# Patient Record
Sex: Male | Born: 1960 | Race: White | Hispanic: No | Marital: Married
Health system: Southern US, Community
[De-identification: ages and names within clinical notes are randomized; demographics above are authoritative.]

---

## 2012-10-14 ENCOUNTER — Ambulatory Visit: Payer: Self-pay | Admitting: Gastroenterology

## 2012-10-29 ENCOUNTER — Ambulatory Visit: Payer: Self-pay | Admitting: Surgery

## 2012-10-29 LAB — HEPATIC FUNCTION PANEL A (ARMC)
Alkaline Phosphatase: 71 U/L (ref 50–136)
Bilirubin,Total: 0.3 mg/dL (ref 0.2–1.0)
SGOT(AST): 26 U/L (ref 15–37)
SGPT (ALT): 31 U/L (ref 12–78)
Total Protein: 6.7 g/dL (ref 6.4–8.2)

## 2012-10-29 LAB — BASIC METABOLIC PANEL
Anion Gap: 3 — ABNORMAL LOW (ref 7–16)
BUN: 13 mg/dL (ref 7–18)
Chloride: 108 mmol/L — ABNORMAL HIGH (ref 98–107)
Co2: 28 mmol/L (ref 21–32)
EGFR (African American): >60
EGFR (Non-African Amer.): >60
Osmolality: 278 (ref 275–301)
Potassium: 4 mmol/L (ref 3.5–5.1)

## 2012-10-29 LAB — CBC WITH DIFFERENTIAL/PLATELET
Basophil #: 0.2 10*3/uL — ABNORMAL HIGH (ref 0.0–0.1)
Eosinophil #: 0.4 10*3/uL (ref 0.0–0.7)
Eosinophil %: 4.8 %
HCT: 43.5 % (ref 40.0–52.0)
HGB: 14.8 g/dL (ref 13.0–18.0)
Lymphocyte #: 2.3 10*3/uL (ref 1.0–3.6)
Lymphocyte %: 29.1 %
MCH: 31.7 pg (ref 26.0–34.0)
Monocyte #: 0.5 x10 3/mm (ref 0.2–1.0)
Neutrophil #: 4.6 10*3/uL (ref 1.4–6.5)
Neutrophil %: 57.6 %
RDW: 12.8 % (ref 11.5–14.5)

## 2012-10-30 LAB — CEA: CEA: 3.8 ng/mL

## 2012-11-04 ENCOUNTER — Ambulatory Visit: Payer: Self-pay | Admitting: Oncology

## 2012-11-06 ENCOUNTER — Inpatient Hospital Stay: Payer: Self-pay | Admitting: Surgery

## 2012-11-06 LAB — CREATININE, SERUM
Creatinine: 1.21 mg/dL (ref 0.60–1.30)
EGFR (African American): >60

## 2012-11-07 LAB — BASIC METABOLIC PANEL
Anion Gap: 2 — ABNORMAL LOW (ref 7–16)
BUN: 14 mg/dL (ref 7–18)
Co2: 31 mmol/L (ref 21–32)
EGFR (African American): >60
Sodium: 138 mmol/L (ref 136–145)

## 2012-11-07 LAB — CBC WITH DIFFERENTIAL/PLATELET
Basophil #: 0 10*3/uL (ref 0.0–0.1)
Eosinophil #: 0 10*3/uL (ref 0.0–0.7)
Eosinophil %: 0.1 %
HCT: 42.6 % (ref 40.0–52.0)
HGB: 14.7 g/dL (ref 13.0–18.0)
Lymphocyte #: 1.4 10*3/uL (ref 1.0–3.6)
MCH: 32 pg (ref 26.0–34.0)
MCHC: 34.6 g/dL (ref 32.0–36.0)
Monocyte #: 1.1 x10 3/mm — ABNORMAL HIGH (ref 0.2–1.0)
Monocyte %: 12.4 %
Neutrophil #: 6.6 10*3/uL — ABNORMAL HIGH (ref 1.4–6.5)
Neutrophil %: 72.1 %
Platelet: 174 10*3/uL (ref 150–440)
RBC: 4.6 10*6/uL (ref 4.40–5.90)
RDW: 12.8 % (ref 11.5–14.5)
WBC: 9.1 10*3/uL (ref 3.8–10.6)

## 2012-11-08 ENCOUNTER — Ambulatory Visit: Payer: Self-pay | Admitting: Oncology

## 2012-11-11 LAB — CREATININE, SERUM
Creatinine: 1.06 mg/dL (ref 0.60–1.30)
EGFR (African American): >60
EGFR (Non-African Amer.): >60

## 2012-11-15 LAB — PATHOLOGY REPORT

## 2012-11-27 ENCOUNTER — Ambulatory Visit: Payer: Self-pay | Admitting: Surgery

## 2012-11-27 LAB — CBC WITH DIFFERENTIAL/PLATELET
Basophil #: 0.1 10*3/uL (ref 0.0–0.1)
Basophil %: 1.1 %
Eosinophil #: 0.1 10*3/uL (ref 0.0–0.7)
Eosinophil %: 1.7 %
HCT: 48.7 % (ref 40.0–52.0)
Lymphocyte #: 1.9 10*3/uL (ref 1.0–3.6)
MCHC: 32.8 g/dL (ref 32.0–36.0)
Monocyte #: 0.7 x10 3/mm (ref 0.2–1.0)
Monocyte %: 8.4 %
Neutrophil #: 5.5 10*3/uL (ref 1.4–6.5)
Neutrophil %: 66.5 %
RBC: 5.28 10*6/uL (ref 4.40–5.90)
RDW: 13 % (ref 11.5–14.5)
WBC: 8.3 10*3/uL (ref 3.8–10.6)

## 2012-11-27 LAB — BASIC METABOLIC PANEL
Anion Gap: 8 (ref 7–16)
Calcium, Total: 9.7 mg/dL (ref 8.5–10.1)
Chloride: 101 mmol/L (ref 98–107)
Creatinine: 1.14 mg/dL (ref 0.60–1.30)
EGFR (Non-African Amer.): >60
Glucose: 95 mg/dL (ref 65–99)
Osmolality: 280 (ref 275–301)

## 2012-11-29 ENCOUNTER — Ambulatory Visit: Payer: Self-pay | Admitting: Surgery

## 2012-12-03 ENCOUNTER — Ambulatory Visit: Payer: Self-pay | Admitting: Oncology

## 2012-12-04 ENCOUNTER — Ambulatory Visit: Payer: Self-pay | Admitting: Oncology

## 2012-12-05 ENCOUNTER — Ambulatory Visit: Payer: Self-pay | Admitting: Surgery

## 2012-12-10 LAB — CBC CANCER CENTER
Basophil #: 0.1 x10 3/mm (ref 0.0–0.1)
Eosinophil #: 0.3 x10 3/mm (ref 0.0–0.7)
Eosinophil %: 4.3 %
HCT: 45.2 % (ref 40.0–52.0)
Lymphocyte #: 2.3 x10 3/mm (ref 1.0–3.6)
Lymphocyte %: 28.9 %
MCHC: 33.7 g/dL (ref 32.0–36.0)
Monocyte %: 8.5 %
Neutrophil #: 4.5 x10 3/mm (ref 1.4–6.5)
Neutrophil %: 56.8 %
Platelet: 205 x10 3/mm (ref 150–440)
RBC: 4.89 10*6/uL (ref 4.40–5.90)
RDW: 13 % (ref 11.5–14.5)
WBC: 7.9 x10 3/mm (ref 3.8–10.6)

## 2012-12-10 LAB — COMPREHENSIVE METABOLIC PANEL
Albumin: 3.5 g/dL (ref 3.4–5.0)
Alkaline Phosphatase: 110 U/L (ref 50–136)
BUN: 14 mg/dL (ref 7–18)
Chloride: 104 mmol/L (ref 98–107)
EGFR (Non-African Amer.): 55 — ABNORMAL LOW
Glucose: 83 mg/dL (ref 65–99)
Potassium: 4.4 mmol/L (ref 3.5–5.1)
SGPT (ALT): 52 U/L (ref 12–78)
Sodium: 143 mmol/L (ref 136–145)

## 2012-12-11 LAB — CEA: CEA: 2.4 ng/mL (ref 0.0–4.7)

## 2012-12-18 LAB — CBC CANCER CENTER
Basophil %: 1.4 %
Eosinophil #: 0.3 x10 3/mm (ref 0.0–0.7)
HCT: 43.3 % (ref 40.0–52.0)
HGB: 14.2 g/dL (ref 13.0–18.0)
MCH: 30.2 pg (ref 26.0–34.0)
Monocyte %: 8.1 %
RDW: 12.9 % (ref 11.5–14.5)

## 2012-12-25 LAB — CBC CANCER CENTER
Basophil #: 0.1 x10 3/mm (ref 0.0–0.1)
Basophil %: 1.9 %
Eosinophil %: 4.2 %
HCT: 44.5 % (ref 40.0–52.0)
HGB: 14.6 g/dL (ref 13.0–18.0)
Lymphocyte #: 1.6 x10 3/mm (ref 1.0–3.6)
Lymphocyte %: 34 %
MCH: 30.5 pg (ref 26.0–34.0)
MCV: 93 fL (ref 80–100)
Monocyte #: 0.5 x10 3/mm (ref 0.2–1.0)
Monocyte %: 11.4 %
Neutrophil #: 2.2 x10 3/mm (ref 1.4–6.5)
Neutrophil %: 48.5 %
Platelet: 293 x10 3/mm (ref 150–440)
RBC: 4.8 10*6/uL (ref 4.40–5.90)
RDW: 13.4 % (ref 11.5–14.5)
WBC: 4.6 x10 3/mm (ref 3.8–10.6)

## 2012-12-25 LAB — COMPREHENSIVE METABOLIC PANEL
Alkaline Phosphatase: 96 U/L (ref 50–136)
Calcium, Total: 8.9 mg/dL (ref 8.5–10.1)
Creatinine: 1.07 mg/dL (ref 0.60–1.30)
Osmolality: 283 (ref 275–301)
Potassium: 3.9 mmol/L (ref 3.5–5.1)
SGOT(AST): 30 U/L (ref 15–37)
SGPT (ALT): 43 U/L (ref 12–78)
Sodium: 141 mmol/L (ref 136–145)
Total Protein: 6.8 g/dL (ref 6.4–8.2)

## 2012-12-27 ENCOUNTER — Ambulatory Visit: Payer: Self-pay | Admitting: Family Medicine

## 2013-01-01 LAB — CBC CANCER CENTER
Basophil %: 1.9 %
Eosinophil %: 4.4 %
HCT: 43.6 % (ref 40.0–52.0)
Lymphocyte #: 2.4 x10 3/mm (ref 1.0–3.6)
Lymphocyte %: 48.1 %
MCH: 30.6 pg (ref 26.0–34.0)
MCV: 92 fL (ref 80–100)
Monocyte #: 0.6 x10 3/mm (ref 0.2–1.0)
Neutrophil %: 34.1 %
RDW: 13.6 % (ref 11.5–14.5)
WBC: 4.9 x10 3/mm (ref 3.8–10.6)

## 2013-01-04 ENCOUNTER — Ambulatory Visit: Payer: Self-pay | Admitting: Oncology

## 2013-01-08 LAB — CBC CANCER CENTER
Basophil #: 0.1 x10 3/mm (ref 0.0–0.1)
Eosinophil #: 0.2 x10 3/mm (ref 0.0–0.7)
Eosinophil %: 4.1 %
HGB: 14.4 g/dL (ref 13.0–18.0)
Lymphocyte #: 1.4 x10 3/mm (ref 1.0–3.6)
MCH: 30.6 pg (ref 26.0–34.0)
MCV: 93 fL (ref 80–100)
Monocyte %: 12.2 %
Neutrophil %: 48.6 %
Platelet: 164 x10 3/mm (ref 150–440)
RBC: 4.7 10*6/uL (ref 4.40–5.90)
RDW: 14.1 % (ref 11.5–14.5)
WBC: 4.4 x10 3/mm (ref 3.8–10.6)

## 2013-01-08 LAB — COMPREHENSIVE METABOLIC PANEL
Alkaline Phosphatase: 91 U/L (ref 50–136)
Anion Gap: 9 (ref 7–16)
Bilirubin,Total: 0.6 mg/dL (ref 0.2–1.0)
Chloride: 107 mmol/L (ref 98–107)
Co2: 29 mmol/L (ref 21–32)
Creatinine: 1.05 mg/dL (ref 0.60–1.30)
EGFR (African American): >60
EGFR (Non-African Amer.): >60
Glucose: 111 mg/dL — ABNORMAL HIGH (ref 65–99)
Osmolality: 290 (ref 275–301)
Potassium: 3.9 mmol/L (ref 3.5–5.1)
SGOT(AST): 73 U/L — ABNORMAL HIGH (ref 15–37)
SGPT (ALT): 97 U/L — ABNORMAL HIGH (ref 12–78)
Total Protein: 6.6 g/dL (ref 6.4–8.2)

## 2013-01-15 LAB — CBC CANCER CENTER
Basophil #: 0 x10 3/mm (ref 0.0–0.1)
Basophil %: 1.2 %
HCT: 44.9 % (ref 40.0–52.0)
HGB: 15 g/dL (ref 13.0–18.0)
Lymphocyte #: 1.6 x10 3/mm (ref 1.0–3.6)
Lymphocyte %: 45.3 %
MCH: 30.9 pg (ref 26.0–34.0)
MCV: 92 fL (ref 80–100)
Neutrophil #: 1.4 x10 3/mm (ref 1.4–6.5)
Platelet: 159 x10 3/mm (ref 150–440)
RDW: 14.3 % (ref 11.5–14.5)

## 2013-01-22 LAB — COMPREHENSIVE METABOLIC PANEL
Albumin: 3.3 g/dL — ABNORMAL LOW (ref 3.4–5.0)
Chloride: 103 mmol/L (ref 98–107)
Co2: 31 mmol/L (ref 21–32)
Creatinine: 0.99 mg/dL (ref 0.60–1.30)
Glucose: 110 mg/dL — ABNORMAL HIGH (ref 65–99)
Osmolality: 286 (ref 275–301)
Potassium: 3.8 mmol/L (ref 3.5–5.1)
SGOT(AST): 86 U/L — ABNORMAL HIGH (ref 15–37)
SGPT (ALT): 154 U/L — ABNORMAL HIGH (ref 12–78)
Total Protein: 6.4 g/dL (ref 6.4–8.2)

## 2013-01-22 LAB — CBC CANCER CENTER
Basophil %: 2.3 %
Eosinophil #: 0.1 x10 3/mm (ref 0.0–0.7)
Eosinophil %: 3.8 %
HCT: 43.2 % (ref 40.0–52.0)
HGB: 14.3 g/dL (ref 13.0–18.0)
Lymphocyte #: 1.3 x10 3/mm (ref 1.0–3.6)
Lymphocyte %: 35.6 %
MCHC: 33 g/dL (ref 32.0–36.0)
Monocyte %: 13.2 %
Neutrophil #: 1.6 x10 3/mm (ref 1.4–6.5)
Neutrophil %: 45.1 %
Platelet: 174 x10 3/mm (ref 150–440)
RBC: 4.62 10*6/uL (ref 4.40–5.90)
RDW: 15.1 % — ABNORMAL HIGH (ref 11.5–14.5)
WBC: 3.6 x10 3/mm — ABNORMAL LOW (ref 3.8–10.6)

## 2013-02-03 ENCOUNTER — Ambulatory Visit: Payer: Self-pay | Admitting: Oncology

## 2013-02-05 LAB — CBC CANCER CENTER
Basophil #: 0.1 x10 3/mm (ref 0.0–0.1)
Eosinophil %: 4.5 %
HGB: 14.7 g/dL (ref 13.0–18.0)
Lymphocyte #: 1.3 x10 3/mm (ref 1.0–3.6)
Lymphocyte %: 35.4 %
MCHC: 33.3 g/dL (ref 32.0–36.0)
MCV: 94 fL (ref 80–100)
Monocyte %: 14.1 %
Platelet: 128 x10 3/mm — ABNORMAL LOW (ref 150–440)
RBC: 4.69 10*6/uL (ref 4.40–5.90)
RDW: 16.1 % — ABNORMAL HIGH (ref 11.5–14.5)
WBC: 3.7 x10 3/mm — ABNORMAL LOW (ref 3.8–10.6)

## 2013-02-05 LAB — COMPREHENSIVE METABOLIC PANEL
Albumin: 3.4 g/dL (ref 3.4–5.0)
Alkaline Phosphatase: 115 U/L
Anion Gap: 8 (ref 7–16)
Bilirubin,Total: 0.7 mg/dL (ref 0.2–1.0)
Chloride: 105 mmol/L (ref 98–107)
Co2: 29 mmol/L (ref 21–32)
EGFR (African American): >60
EGFR (Non-African Amer.): >60
Glucose: 111 mg/dL — ABNORMAL HIGH (ref 65–99)
Osmolality: 286 (ref 275–301)
SGOT(AST): 52 U/L — ABNORMAL HIGH (ref 15–37)
SGPT (ALT): 85 U/L — ABNORMAL HIGH (ref 12–78)
Sodium: 142 mmol/L (ref 136–145)

## 2013-02-19 LAB — CBC CANCER CENTER
Basophil %: 1.2 %
HCT: 42.1 % (ref 40.0–52.0)
HGB: 14.2 g/dL (ref 13.0–18.0)
Lymphocyte #: 1.1 x10 3/mm (ref 1.0–3.6)
Monocyte #: 0.7 x10 3/mm (ref 0.2–1.0)
Neutrophil %: 44.6 %
Platelet: 100 x10 3/mm — ABNORMAL LOW (ref 150–440)
RBC: 4.44 10*6/uL (ref 4.40–5.90)
WBC: 3.5 x10 3/mm — ABNORMAL LOW (ref 3.8–10.6)

## 2013-02-19 LAB — COMPREHENSIVE METABOLIC PANEL
Albumin: 3 g/dL — ABNORMAL LOW (ref 3.4–5.0)
Alkaline Phosphatase: 136 U/L — ABNORMAL HIGH
BUN: 12 mg/dL (ref 7–18)
Calcium, Total: 9.3 mg/dL (ref 8.5–10.1)
Chloride: 107 mmol/L (ref 98–107)
Co2: 27 mmol/L (ref 21–32)
Glucose: 100 mg/dL — ABNORMAL HIGH (ref 65–99)
SGPT (ALT): 69 U/L (ref 12–78)
Sodium: 143 mmol/L (ref 136–145)
Total Protein: 6.4 g/dL (ref 6.4–8.2)

## 2013-02-24 ENCOUNTER — Ambulatory Visit: Payer: Self-pay | Admitting: Surgery

## 2013-03-05 LAB — COMPREHENSIVE METABOLIC PANEL
Albumin: 3.3 g/dL — ABNORMAL LOW (ref 3.4–5.0)
Alkaline Phosphatase: 130 U/L — ABNORMAL HIGH
Anion Gap: 5 — ABNORMAL LOW (ref 7–16)
BUN: 12 mg/dL (ref 7–18)
Bilirubin,Total: 0.6 mg/dL (ref 0.2–1.0)
Calcium, Total: 9.2 mg/dL (ref 8.5–10.1)
Osmolality: 284 (ref 275–301)
Sodium: 143 mmol/L (ref 136–145)
Total Protein: 6.5 g/dL (ref 6.4–8.2)

## 2013-03-05 LAB — CBC CANCER CENTER
HCT: 44 % (ref 40.0–52.0)
HGB: 14.7 g/dL (ref 13.0–18.0)
Lymphocyte %: 35.6 %
MCH: 32.1 pg (ref 26.0–34.0)
MCV: 96 fL (ref 80–100)
Monocyte %: 19.6 %
Neutrophil #: 1.6 x10 3/mm (ref 1.4–6.5)
WBC: 4 x10 3/mm (ref 3.8–10.6)

## 2013-03-06 ENCOUNTER — Ambulatory Visit: Payer: Self-pay | Admitting: Oncology

## 2013-03-07 LAB — CEA: CEA: 5.1 ng/mL — ABNORMAL HIGH (ref 0.0–4.7)

## 2013-04-14 ENCOUNTER — Ambulatory Visit: Payer: Self-pay | Admitting: Oncology

## 2013-04-15 LAB — CEA: CEA: 3 ng/mL (ref 0.0–4.7)

## 2013-05-04 ENCOUNTER — Ambulatory Visit: Payer: Self-pay | Admitting: Oncology

## 2013-06-02 LAB — CBC CANCER CENTER
Basophil #: 0.1 x10 3/mm (ref 0.0–0.1)
Basophil %: 1.7 %
Eosinophil #: 0.2 x10 3/mm (ref 0.0–0.7)
Eosinophil %: 3.3 %
HCT: 47.4 % (ref 40.0–52.0)
HGB: 15.4 g/dL (ref 13.0–18.0)
Lymphocyte #: 2.3 x10 3/mm (ref 1.0–3.6)
Lymphocyte %: 34 %
MCH: 31.4 pg (ref 26.0–34.0)
MCHC: 32.6 g/dL (ref 32.0–36.0)
MCV: 96 fL (ref 80–100)
Monocyte #: 0.5 x10 3/mm (ref 0.2–1.0)
Monocyte %: 7.1 %
Neutrophil #: 3.7 x10 3/mm (ref 1.4–6.5)
Neutrophil %: 53.9 %
Platelet: 207 x10 3/mm (ref 150–440)
RBC: 4.92 10*6/uL (ref 4.40–5.90)
RDW: 12.2 % (ref 11.5–14.5)
WBC: 6.8 x10 3/mm (ref 3.8–10.6)

## 2013-06-02 LAB — COMPREHENSIVE METABOLIC PANEL
ALBUMIN: 3.7 g/dL (ref 3.4–5.0)
ALT: 41 U/L (ref 12–78)
Alkaline Phosphatase: 87 U/L
Anion Gap: 6 — ABNORMAL LOW (ref 7–16)
BILIRUBIN TOTAL: 0.4 mg/dL (ref 0.2–1.0)
BUN: 15 mg/dL (ref 7–18)
CALCIUM: 9.1 mg/dL (ref 8.5–10.1)
CO2: 30 mmol/L (ref 21–32)
CREATININE: 1.09 mg/dL (ref 0.60–1.30)
Chloride: 105 mmol/L (ref 98–107)
EGFR (African American): >60
EGFR (Non-African Amer.): >60
GLUCOSE: 110 mg/dL — AB (ref 65–99)
Osmolality: 283 (ref 275–301)
POTASSIUM: 4.2 mmol/L (ref 3.5–5.1)
SGOT(AST): 33 U/L (ref 15–37)
SODIUM: 141 mmol/L (ref 136–145)
Total Protein: 6.6 g/dL (ref 6.4–8.2)

## 2013-06-03 LAB — CEA: CEA: 3.3 ng/mL (ref 0.0–4.7)

## 2013-06-04 ENCOUNTER — Ambulatory Visit: Payer: Self-pay | Admitting: Oncology

## 2013-09-08 ENCOUNTER — Ambulatory Visit: Payer: Self-pay | Admitting: Oncology

## 2013-09-08 LAB — COMPREHENSIVE METABOLIC PANEL
ANION GAP: 6 — AB (ref 7–16)
AST: 26 U/L (ref 15–37)
Albumin: 3.7 g/dL (ref 3.4–5.0)
Alkaline Phosphatase: 83 U/L
BILIRUBIN TOTAL: 0.5 mg/dL (ref 0.2–1.0)
BUN: 14 mg/dL (ref 7–18)
CO2: 30 mmol/L (ref 21–32)
Calcium, Total: 9.3 mg/dL (ref 8.5–10.1)
Chloride: 106 mmol/L (ref 98–107)
Creatinine: 1.24 mg/dL (ref 0.60–1.30)
EGFR (African American): >60
EGFR (Non-African Amer.): >60
Glucose: 86 mg/dL (ref 65–99)
Osmolality: 283 (ref 275–301)
POTASSIUM: 4.7 mmol/L (ref 3.5–5.1)
SGPT (ALT): 37 U/L (ref 12–78)
SODIUM: 142 mmol/L (ref 136–145)
Total Protein: 6.6 g/dL (ref 6.4–8.2)

## 2013-09-09 LAB — CEA: CEA: 4.7 ng/mL (ref 0.0–4.7)

## 2013-10-04 ENCOUNTER — Ambulatory Visit: Payer: Self-pay | Admitting: Oncology

## 2013-11-04 ENCOUNTER — Ambulatory Visit: Payer: Self-pay | Admitting: Gastroenterology

## 2013-11-18 ENCOUNTER — Ambulatory Visit: Payer: Self-pay | Admitting: Oncology

## 2014-04-04 IMAGING — CR DG CHEST 2V
1 series · 2 of 2 positions shown · non-contrast
Comparison: none

REASON FOR EXAM: colon cancer
COMMENTS:   LMP: (Male)

[Series 1: w chest pa · 0.14mm/px · 2 of 2 slices shown]
[im 1/2]
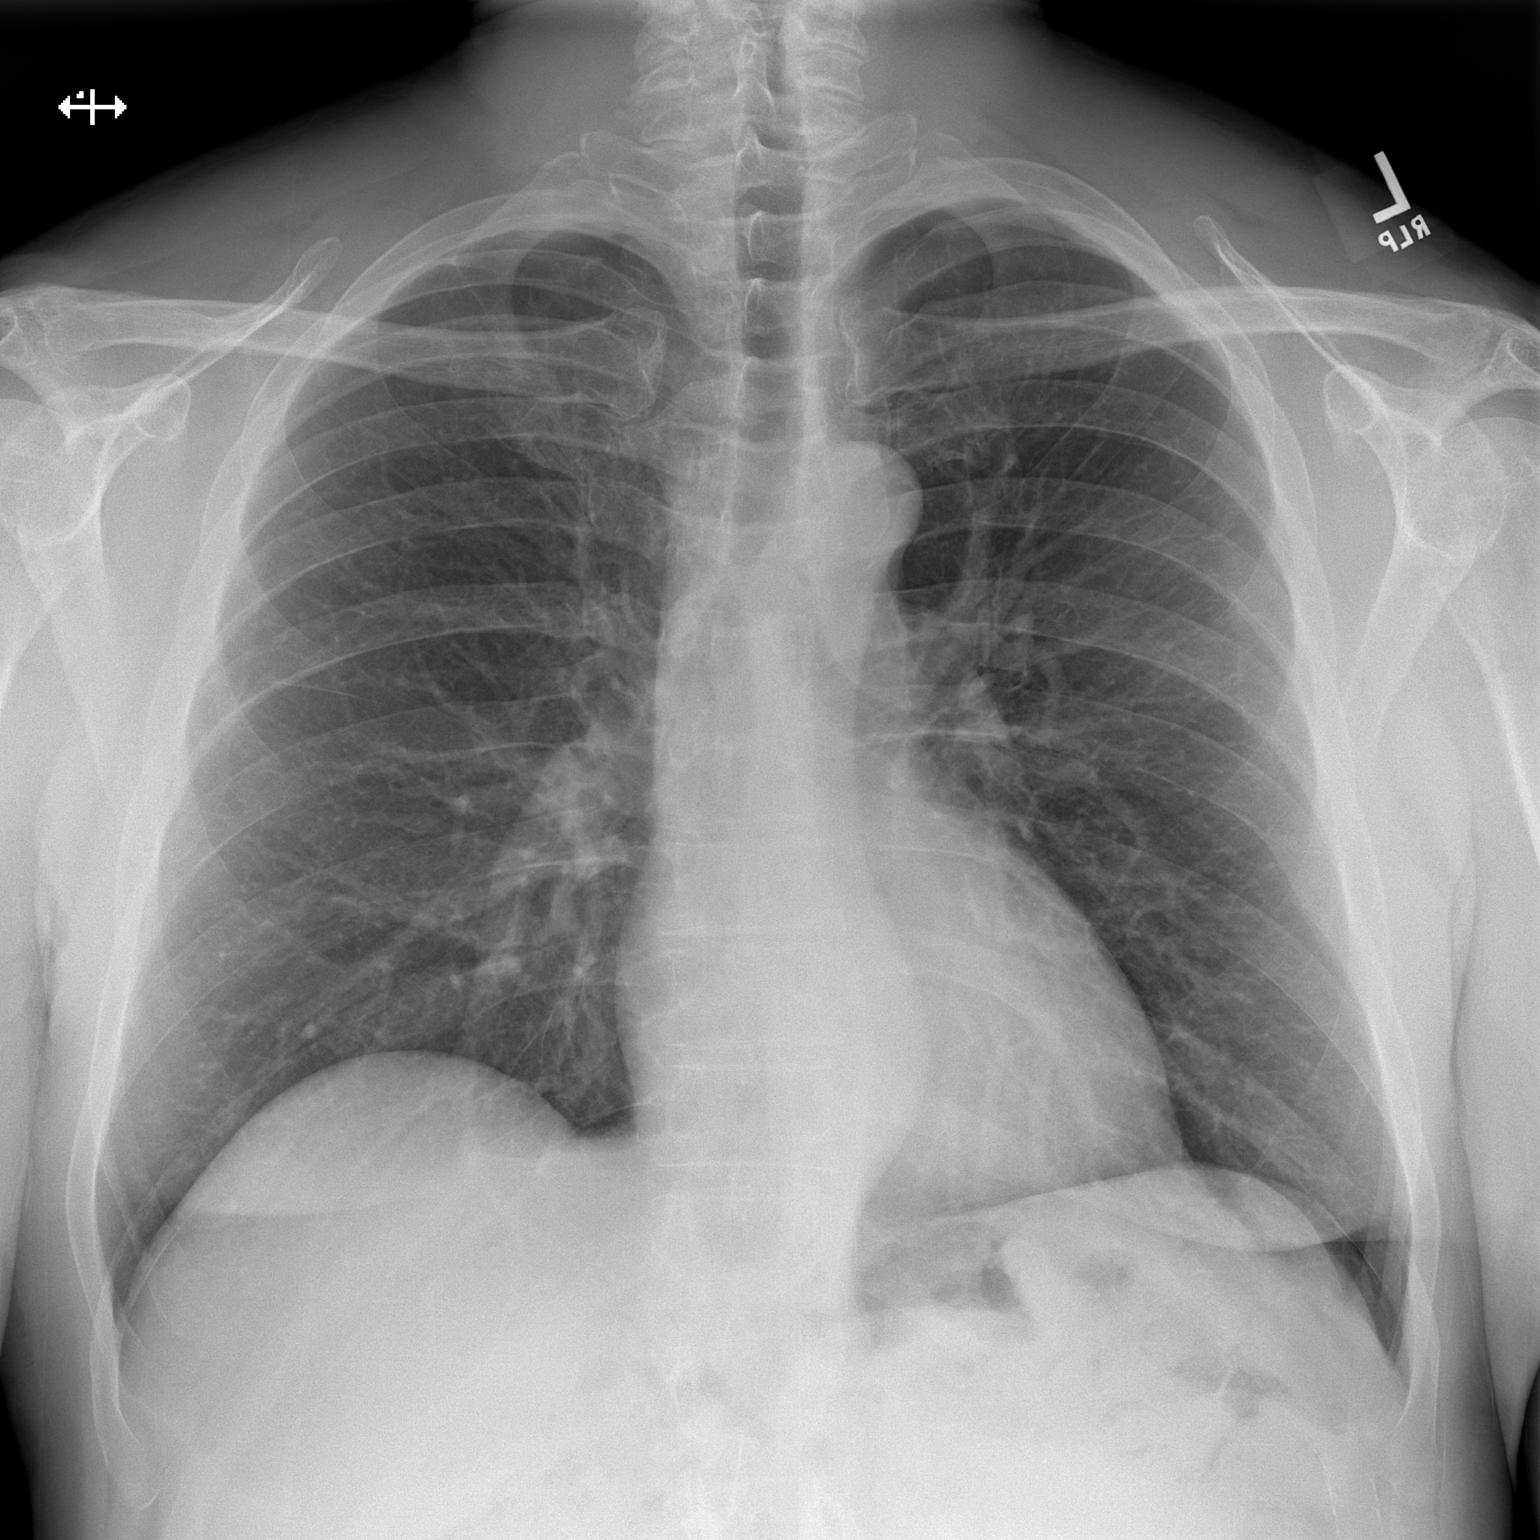
[im 2/2]
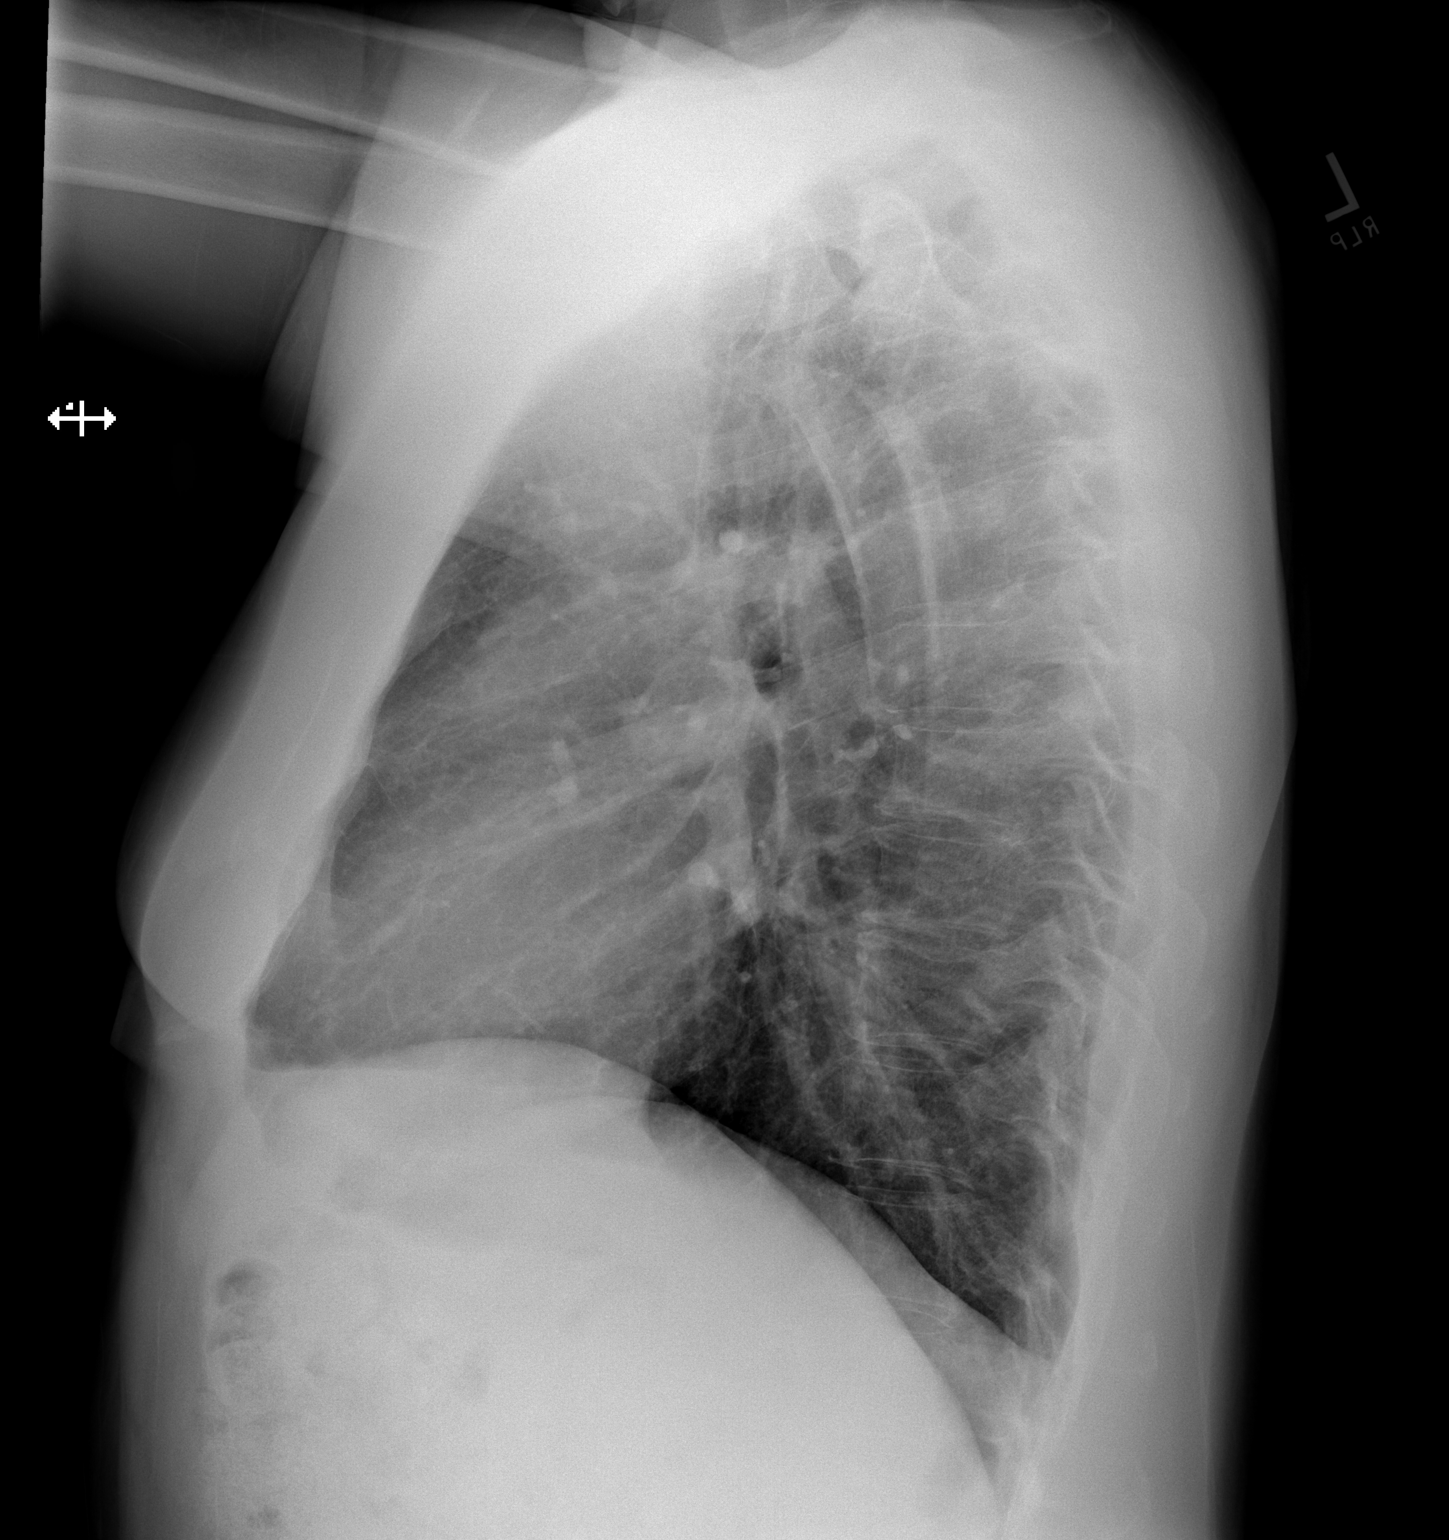

[2 of 2 positions shown; findings below may reference images not displayed]

PROCEDURE:     DXR - DXR CHEST PA (OR AP) AND LATERAL  - October 29, 2012  [DATE]

RESULT:     The lungs are adequately inflated. There is no focal infiltrate.
No pulmonary parenchymal masses are demonstrated. No mediastinal nor hilar
lymphadenopathy is evident. The cardiac silhouette is normal in size. There
is tortuosity of the descending thoracic aorta. The pulmonary vascularity is
not engorged. The bony thorax exhibits no acute abnormality.
IMPRESSION: There are no findings to suggest metastatic disease nor
other acute cardiopulmonary abnormality.

[REDACTED]

## 2014-06-26 NOTE — Op Note (Signed)
PATIENT NAMDrema Halon:  Ian Harrington, Ian Harrington MR#:  [id number] DATE OF BIRTH:  1960/08/29  DATE OF PROCEDURE:  11/06/2012  PREOPERATIVE DIAGNOSIS: Sigmoid colon cancer.   POSTOPERATIVE DIAGNOSIS: Sigmoid colon cancer.   PROCEDURE PERFORMED:  1. Laparoscopic-assisted takedown of splenic flexure mobilization of left colon.  2. Open low anterior resection with low colopelvic staple anastomosis, 29 mm.  3. Incidental appendectomy.   SURGEON: Raynald KempMark A Nolita Kutter, M.D.   ASSISTANT: Hulda Marinimothy Oaks, M.D.   TYPE OF ANESTHESIA: General endotracheal.   FINDINGS: There was a large tumor in the distal sigmoid colon several centimeters above the peritoneal reflection. The sigmoid colon was redundant. The appendix was pathologically normal. No evidence of carcinomatosis and bimanual palpation of the liver demonstrated no abnormalities.   SPECIMENS: As described above.   ESTIMATED BLOOD LOSS: 100 mL.   DRAINS: None.   LAP AND NEEDLE COUNT: Correct x2.   DESCRIPTION OF PROCEDURE: With informed consent, supine position, general endotracheal anesthesia was induced. Foley catheter was placed. The patient was then positioned and padded in dorsal lithotomy.   Sterile prep and drape of the abdomen and perineum was performed. Timeout was observed.   A 12 mm blunt Hassan trocar was placed through an open technique above the umbilicus in a vertical orientation. Pneumoperitoneum was established and laparoscopic evaluation of the liver demonstrated no obvious abnormalities.  An applied GelPort was then placed in the midline through a short vertical incision. An additional 11 mm and 5 mm trocars were placed in the lower abdomen. Splenic flexure was delivered in its entirety utilizing sharp technique and point cautery along the white line of Toldt. Course of the left ureter was identified. Additional adhesions to the left pelvic sidewall were taken down with blunt and sharp technique. The area of the tumor was identified several centimeters  above the peritoneal reflection.   At this point GelPort was removed. The incision was lengthened 2 cm inferiorly Balfour retractor was placed. Small bowel found to be unremarkable and packed into the upper abdomen. The proximal sigmoid colon greater than 5 cm above the mass was divided with single fire of the contoured stapler. Intervening mesentery was then  sequentially scored, clamped, cut and tied utilizing #0 Vicryl sutures and the LigaSure apparatus. Presacral space was entered posteriorly, The course of the right ureter was preserved and identified during the procedure. Lateral stalks were taken down. The bladder flap was raised anteriorly in the avascular plane with blunt technique. The mesentery to the proximal one third of the rectum greater than 5 cm below the tumor was thinned utilizing right angle clamps and ties of #0 Vicryl suture. A second fire of the contoured stapler was applied across upper rectum. The specimen was handed off the field, stitched distal. Margins returned grossly negative.   The proximal colon was then cleansed of  epiploic fat. Pursestring applicator was placed, 2-0 nylon sutures on look needles were then placed. The staple line was excised. The proximal colon dilated easily to 33 mm. The 29 mm ILS stapler was chosen. The anvil was placed in the proximal colon. Pursestring was applied. A small amount of bleeding in the pelvis was controlled with direct pressure and several stitches of 3-0 silk in the mesentery. I then placed 25 mm and 29 mm anvil directly into the rectum under direct visualization. The stapler was then placed, the coupling mechanism was engaged. Adela Lanknvil was married to the spike. The stapler was closed and fired. Two intact donuts were identified. The anastomosis was hermetically sealed.  Five milliliters of Gelfoam with thrombin were applied to the fatty tissue around the pelvis to ensure hemostasis.   Epiploic fat was then sutured over the anastomosis with  a single 3-0 silk suture. Incidental appendectomy was then performed with division of the mesoappendix between clamps and ties of #0 Vicryl suture and the base of the appendix transected with a contoured stapler. The staple line was imbricated.   With lap and needle count correct x2, the abdomen was then irrigated. Change of OR table with instruments, gown and gloves for the surgeon's and placement of sterile towels around the wound to achieve closure utilizing #1 Vicryl from the extremes of the wounds. Skin staples are used to reapproximate skin edges and a sterile occlusive dressing was placed and the patient was subsequently extubated and taken to the recovery room in stable and satisfactory condition by anesthesia.    ____________________________ Redge Gainer. Egbert Garibaldi, MD mab:sg D: 11/07/2012 09:50:46 ET T: 11/07/2012 09:57:09 ET JOB#: [id number]  cc: Loraine Leriche A. Egbert Garibaldi, MD, <Dictator> Raynald Kemp MD ELECTRONICALLY SIGNED 11/12/2012 9:50

## 2014-06-26 NOTE — Op Note (Signed)
PATIENT NAMDrema Harrington:  Novitsky, Barney MR#:  [id number] DATE OF BIRTH:  [birth year]  DATE OF PROCEDURE:  02/24/2013  PREOPERATIVE DIAGNOSIS: Colon carcinoma.   POSTOPERATIVE DIAGNOSIS: Colon carcinoma.  PROCEDURE: Port removal.   SURGEON: Tiney Rougealph Ely, MD  ANESTHESIA: Monitored anesthetic care.   DESCRIPTION OF PROCEDURE: With the patient in the supine position after induction of appropriate intravenous sedation, the patient's right chest was prepped with ChloraPrep and draped with sterile towels. 1% Xylocaine buffered with sodium bicarbonate was injected over the palpable port. The previous incision was reopened and the port pocket incised. The port was identified and removed without difficulty. The area was irrigated. The vascular insertion site was closed with 3-0 Vicryl and the subcutaneous space was obliterated with 3-0 Vicryl. The skin was closed with a running subcuticular suture of 4-0 Vicryl. Benzoin and Steri-Strips were applied. The patient was returned to the recovery room having tolerated the procedure well. Sponge, instrument, and needle counts were correct x2 in the operating room.  ____________________________ Quentin Orealph L. Ely III, MD rle:sb D: 02/24/2013 10:51:17 ET T: 02/24/2013 11:21:47 ET JOB#: [id number]  cc: Quentin Orealph L. Ely III, MD, <Dictator> Gerome SamJanak K. Doylene Canninghoksi, MD Quentin OreALPH L ELY MD ELECTRONICALLY SIGNED 02/26/2013 17:29

## 2014-06-26 NOTE — Discharge Summary (Signed)
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   PRINCIPAL PROCEDURES:  1.  Laparoscopic-assisted sigmoid colectomy with low stapled colopelvic anastomosis on 11/07/2012.    2.  Oncology consultation featuring Dr. Orlie DakinFinnegan.   HOSPITAL COURSE SUMMARY: The patient had an uneventful postoperative stay. He had adequate pain control with PCA. He was given Entereg postoperatively as well as preoperatively. By postoperative #3, the patient had a small amount of flatus. By postoperative day #4, more flatus but no bowel movement. Wound was healing nicely. By postoperative day #5, the patient had a small BM and was tolerating a regular diet, took oral pain medications. Pathology did return 1 lymph node positive. The patient was seen by oncology and outpatient followup will be arranged. He was, therefore, discharged home with stable condition on postoperative day #5 with followup with me in the office in 1 week's time.   DISCHARGE MEDICATIONS: Include home medications found on the reconciliation form, as well tapentadol 50 mg tablet every 6 hours as needed for moderate pain.   Call with any questions or concerns.     ____________________________ Redge GainerMark A. Egbert GaribaldiBird, MD mab:dmm D: 11/25/2012 09:15:00 ET T: 11/25/2012 09:35:17 ET JOB#: [id number]  cc: Loraine LericheMark A. Egbert GaribaldiBird, MD, <Dictator> Raynald KempMARK A Latreece Mochizuki MD ELECTRONICALLY SIGNED 11/25/2012 10:57

## 2014-06-26 NOTE — Op Note (Signed)
PATIENT NAMDrema Harrington:  Millon, Lief MR#:  [id number] DATE OF BIRTH:  May 27, 1960  DATE OF PROCEDURE:  12/05/2012  PREOPERATIVE DIAGNOSIS: Stage III colon cancer.   POSTOPERATIVE DIAGNOSIS: Stage III colon cancer.   PROCEDURE PERFORMED: Right internal jugular vein port placement.   SURGEON: Natale LayMark Graviel Payeur, M.D.   ASSISTANT: None.   ANESTHESIA: Local with heavy intravenous sedation.   FINDINGS: Catheter flushed and aspirated without difficulty. There was no ectopy at the end of the operation.   ESTIMATED BLOOD LOSS: Minimal.   DESCRIPTION OF PROCEDURE: With informed consent, supine position, percutaneous monitoring lines were placed. The patient's right upper chest and neck were sterilely prepped and draped with ChloraPrep solution followed by Ioban draping. Ultrasound-guided visualization of the right internal jugular vein was performed. With the patient in Trendelenburg, a 22-gauge finder needle was placed into the vein with return of nonpulsatile venous-appearing blood. A larger bore catheter needle was then placed. Wire was passed. Ectopy was noted. Fluoroscopy demonstrated it to be in the right-sided chambers of the heart. A subcutaneous pocket was then fashioned on the anterior chest wall after local anesthesia and transverse incision. A small incision was made overlying the catheter in the right neck. Tunneling was then performed between these 2 sites. Catheter tubing was brought between them. Peel-away introducer and dilator were placed over the wire. Wire and dilator were removed. The catheter was then inserted to 40 cm through the peel-away introducer with peel-away introducer being removed. The catheter then was shortened under dynamic fluoroscopy to the appropriate length, at the superior vena cava, right atrial junction. It flushed and aspirated easily. It was amputated near the exit site, assembled to the reservoir. The reservoir was then placed into the subcutaneous pocket. It flushed and aspirated  easily without any difficulty or leaks. 3 mL of 1000 units/mL heparinized saline was then instilled into the reservoir and tubing. The reservoir was then attached to the clavipectoral fascia at 2 points with 3-0 PDS suture. The deep layer of the pocket was obliterated with interrupted 3-0 Vicryl sutures. 4-0 Vicryl suture was applied to all skin edges. Dermabond, Telfa, and Tegaderm were then applied. The patient was then subsequently taken to the recovery room in stable and satisfactory condition by anesthesia services.  ____________________________ Redge GainerMark A. Egbert GaribaldiBird, MD mab:sb D: 12/05/2012 15:24:54 ET T: 12/05/2012 15:49:33 ET JOB#: [id number]  cc: Loraine LericheMark A. Egbert GaribaldiBird, MD, <Dictator> Raynald KempMARK A Zacari Stiff MD ELECTRONICALLY SIGNED 12/05/2012 17:33

## 2014-06-26 NOTE — Consult Note (Signed)
History of Present Illness:  Reason for Consult Stage IIIB adenocarcinoma of the colon.   HPI   Patient is a 54 year old male with no significant past medical history who recently underwent surgery to remove a large colon mass near the splenic flexure.  He was also found to have 2 lymph nodes positive for disease.  Currently, he still has mild surgical tenderness but otherwise feels well.  He has no neurologic complaints.  He denies any chest pain or shortness of breath.  He denies any weight loss.  He denies any fevers.  He denies any nausea, vomiting, constipation, or diarrhea.  Patient offers no further specific complaints today.  PFSH:  Additional Past Medical and Surgical History Multiple hand and knee surgeries.  Family history: Negative and noncontributory.  Social history: Patient denies tobacco or alcohol.   Review of Systems:  Performance Status (ECOG) 0   Review of Systems   As per HPI. Otherwise, 10 point system review was negative.   NURSING NOTES: **Vital Signs.:   05-Sep-14 13:55   Vital Signs Type: Routine   Temperature Temperature (F): 98.2   Celsius: 36.7   Temperature Source: oral   Pulse Pulse: 95   Respirations Respirations: 18   Systolic BP Systolic BP: 893   Diastolic BP (mmHg) Diastolic BP (mmHg): 94   Mean BP: 107   BP Source  if not from Vital Sign Device: non-invasive   Pulse Ox % Pulse Ox %: 97   Pulse Ox Activity Level: At rest   Oxygen Delivery: Room Air/ 21 %   Physical Exam:  Physical Exam General: Well-developed, well-nourished, no acute distress. Eyes: Pink conjunctiva, anicteric sclera. HEENT: Normocephalic, moist mucous membranes, clear oropharnyx. Lungs: Clear to auscultation bilaterally. Heart: Regular rate and rhythm. No rubs, murmurs, or gallops. Abdomen: Soft, nontender, nondistended. No organomegaly noted, normoactive bowel sounds.  surgical scar noted Musculoskeletal: No edema, cyanosis, or clubbing. Neuro:  Alert, answering all questions appropriately. Cranial nerves grossly intact. Skin: No rashes or petechiae noted. Psych: Normal affect.    Oxycodone: N/V/Diarrhea    Vitamin B-12 1000 mcg oral tablet: 1 tab(s) orally once a day, Status: Active, Quantity: 0, Refills: None   repair gold protealytic enzyme: 2 tab(s) orally once a day (at bedtime), Status: Active, Quantity: 0, Refills: None   vitamin A 10000 units oral capsule: 2 cap(s) orally once a day, Status: Active, Quantity: 0, Refills: None   vitamin E 400 intl units oral capsule: 2 cap(s) orally once a day, Status: Active, Quantity: 0, Refills: None   Co Q-10: with Vitamin C 5 mg  50 milligram(s) orally   4 capsules once a day, Status: Active, Quantity: 0, Refills: None   Quercetin with bromelain: 3 cap(s)  once a day, Status: Active, Quantity: 0, Refills: None   St. John's Wort - oral capsule: 1 cap(s) orally once a day (in the morning), Status: Active, Quantity: 0, Refills: None   Amino Essentails: 4 cap(s)  once a day, Status: Active, Quantity: 0, Refills: None   foundation formula support digesttion: 6 tab(s) orally once a day (in the morning), Status: Active, Quantity: 0, Refills: None   buffered super bio c: 4 tab(s) orally once a day (in the morning), Status: Active, Quantity: 0, Refills: None   Alpha Lipoic Acid 100 mg oral capsule: 4 cap(s) orally once a day, Status: Active, Quantity: 0, Refills: None   Vitamin D3 1000 intl units oral tablet: 1 tab(s) orally once a day, Status: Active, Quantity: 0, Refills: None  renew formula t support organs of elimination: 4 tab(s) orally once a day (in the morning), Status: Active, Quantity: 0, Refills: None   L-Glutathione: 2 cap(s)  once a day, Status: Active, Quantity: 0, Refills: None   tumeric: 3 cap(s) orally once a day (in the morning), Status: Active, Quantity: 0, Refills: None   conjugated linoleic acid: 2 tab(s) orally once a day (in the morning), Status: Active,  Quantity: 0, Refills: None   NAC: 500 milligram(s) capsules, 3 capsules  orally once a day, Status: Active, Quantity: 0, Refills: None   vitamin B 100: 2 cap(s) orally once a day (in the morning), Status: Active, Quantity: 0, Refills: None   Vitamin B6 50 mg oral tablet: pyridoxal 36m per tab  2 tab(s) orally once a day, Status: Active, Quantity: 0, Refills: None   multivitamin: 1 tab(s) orally once a day (in the morning), Status: Active, Quantity: 0, Refills: None   Mega Multi Mineral: 4 tab(s)  once a day, Status: Active, Quantity: 0, Refills: None   Primrose Oil: 1 cap(s) orally once a day (in the morning), Status: Active, Quantity: 0, Refills: None  Laboratory Results: Routine Chem:  04-Sep-14 04:33   Glucose, Serum  121  BUN 14  Creatinine (comp) 1.08  Sodium, Serum 138  Potassium, Serum 4.1  Chloride, Serum 105  CO2, Serum 31  Calcium (Total), Serum 8.6  Anion Gap  2  Osmolality (calc) 277  eGFR (African American) >60  eGFR (Non-African American) >60 (eGFR values <666mmin/1.73 m2 may be an indication of chronic kidney disease (CKD). Calculated eGFR is useful in patients with stable renal function. The eGFR calculation will not be reliable in acutely ill patients when serum creatinine is changing rapidly. It is not useful in  patients on dialysis. The eGFR calculation may not be applicable to patients at the low and high extremes of body sizes, pregnant women, and vegetarians.)  Routine Hem:  04-Sep-14 04:33   WBC (CBC) 9.1  RBC (CBC) 4.60  Hemoglobin (CBC) 14.7  Hematocrit (CBC) 42.6  Platelet Count (CBC) 174  MCV 93  MCH 32.0  MCHC 34.6  RDW 12.8  Neutrophil % 72.1  Lymphocyte % 14.9  Monocyte % 12.4  Eosinophil % 0.1  Basophil % 0.5  Neutrophil #  6.6  Lymphocyte # 1.4  Monocyte #  1.1  Eosinophil # 0.0  Basophil # 0.0 (Result(s) reported on 07 Nov 2012 at 05:06AM.)   Assessment and Plan: Impression:   Stage IIIB adenocarcinoma of the  colon. Plan:   1.  Colon cancer: Patient will definitely require adjuvant chemotherapy with FOLFOX.  Plan to initiate treatment approximately one month after his operation which occurred on November 07, 2012.  Preop CEA was normal at 3.8.  Prior to initiating treatment patient will require port placement which likely can be accomplished as an outpatient.  Patient will [facility]up [facility] [place] [facility] approximately 2 weeks for evaluation and treatment planning.  He expressed understanding and was in agreement with this plan. consult, call questions.  Electronic Signatures: FiDelight HohMD)  (Signed 05-Sep-14 16:53)  Authored: HISTORY OF PRESENT ILLNESS, PFSH, ROS, NURSING NOTES, PE, ALLERGIES, HOME MEDICATIONS, LABS, ASSESSMENT AND PLAN   Last Updated: 05-Sep-14 16:53 by FiDelight HohMD)

## 2014-07-18 IMAGING — US US PELVIS LIMITED
1 series · 13 of 25 positions shown · non-contrast
Comparison: PET-CT 12/03/2012. CT Abdomen and Pelvis 11/29/2012.

CLINICAL DATA: 52-year-old male with new right testicular palpable
abnormality. Currently undergoing treatment for colon cancer.
Initial encounter.

EXAM:
SCROTAL ULTRASOUND
DOPPLER ULTRASOUND OF THE TESTICLES
TECHNIQUE: Complete ultrasound examination of the testicles, epididymis, and
other scrotal structures was performed. Color and spectral Doppler
ultrasound were also utilized to evaluate blood flow to the
testicles.

[Series 1: us pelvis limited · 0.08mm/px · 13 of 65 slices shown]
[im 1/65]
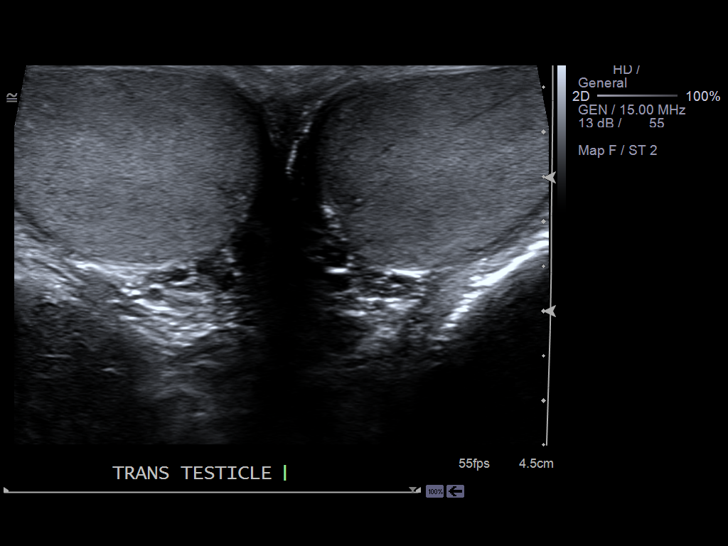
[im 6/65]
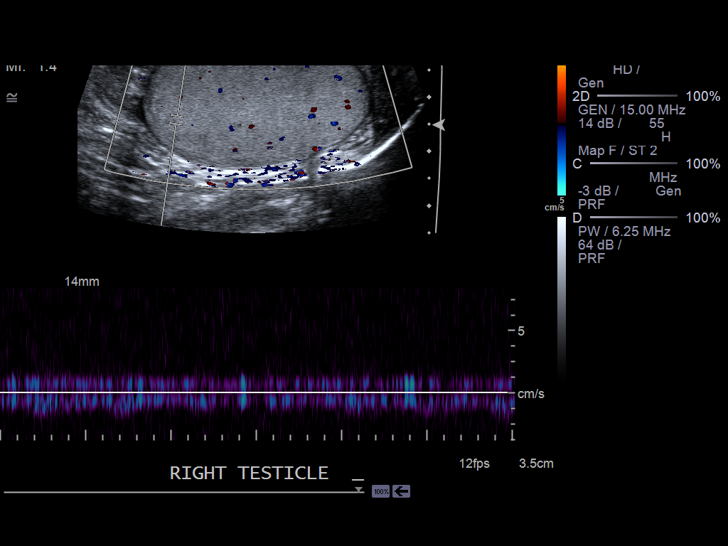
[im 11/65]
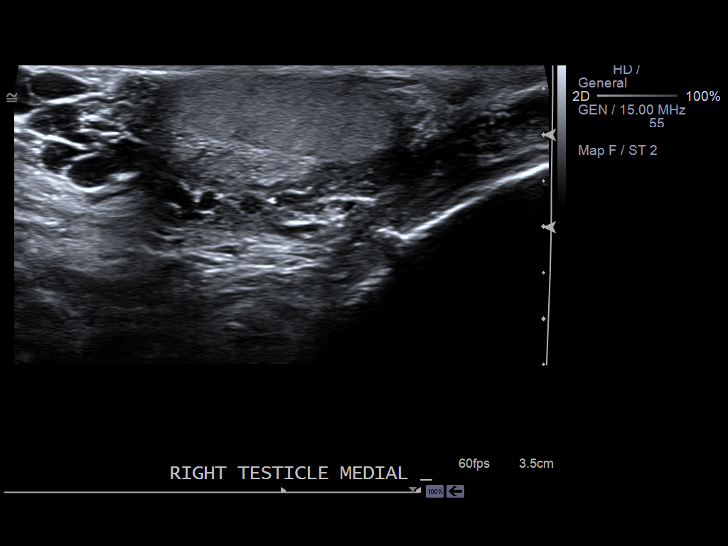
[im 17/65]
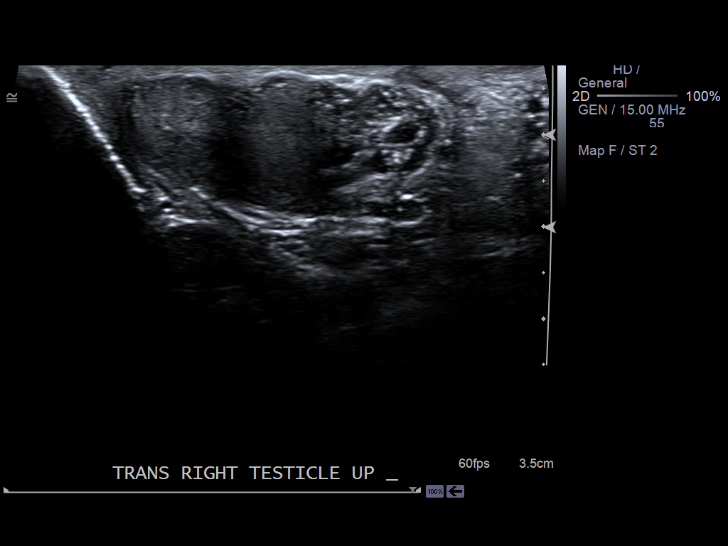
[im 22/65]
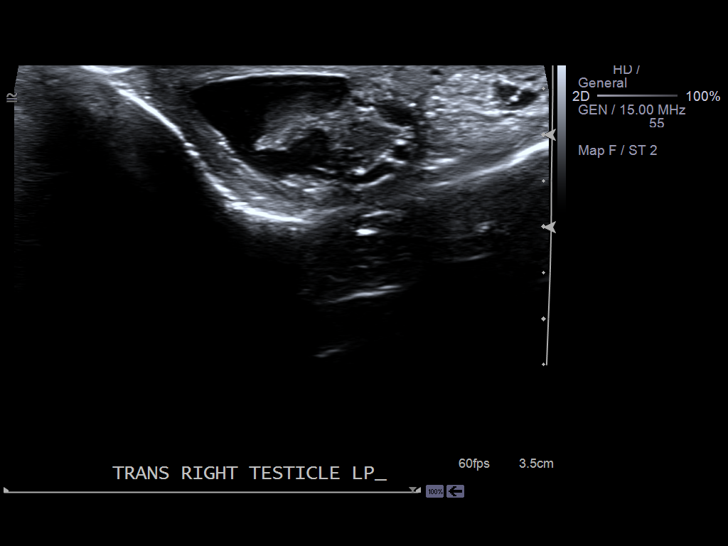
[im 27/65]
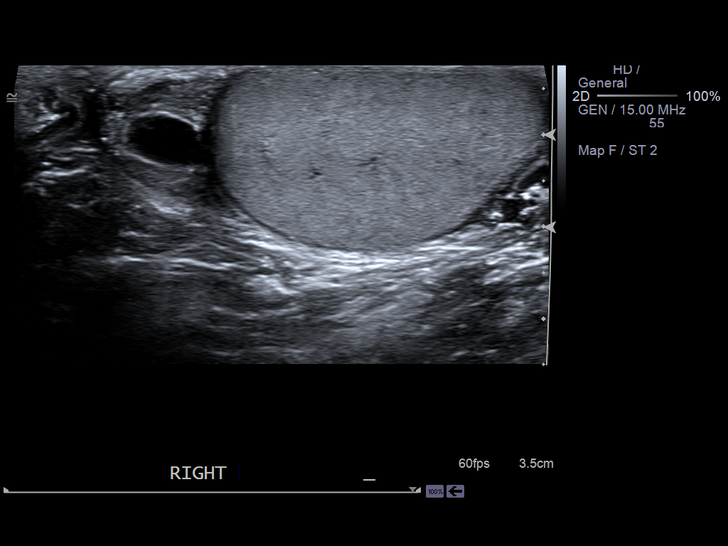
[im 33/65]
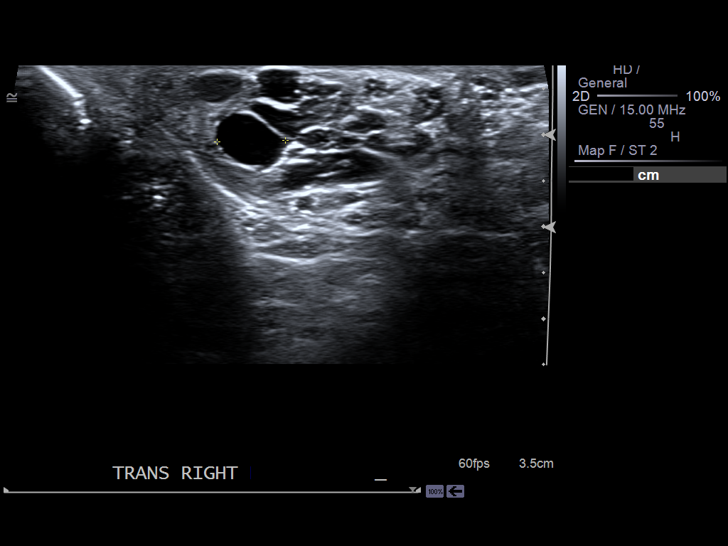
[im 38/65]
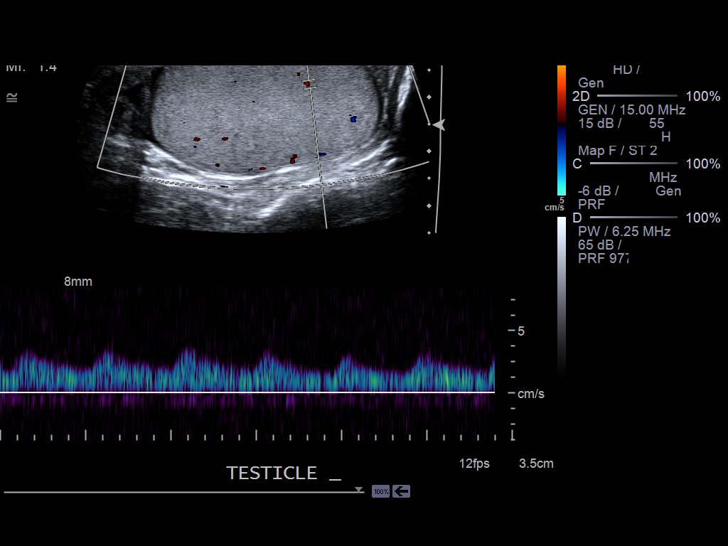
[im 43/65]
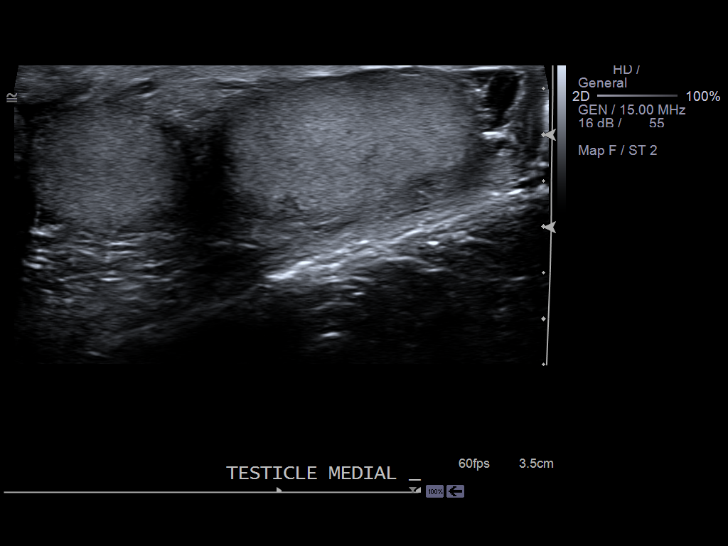
[im 49/65]
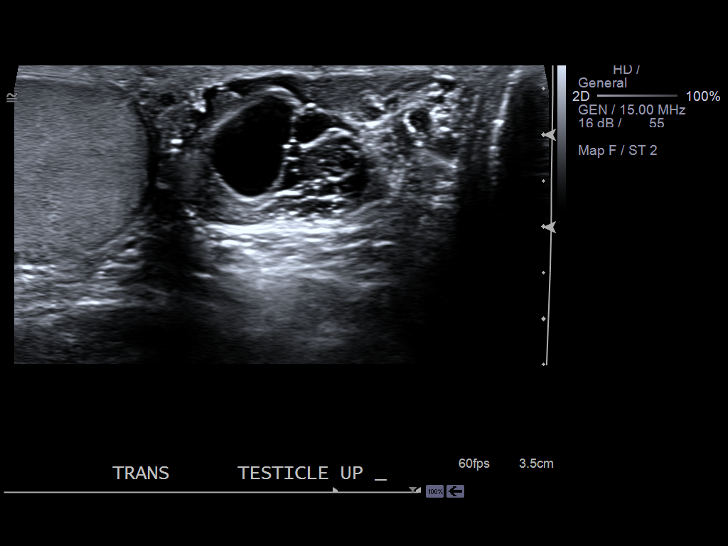
[im 54/65]
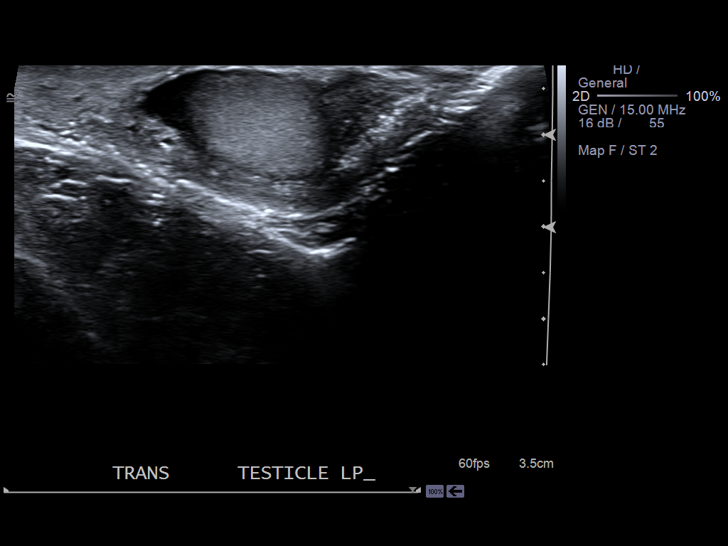
[im 59/65]
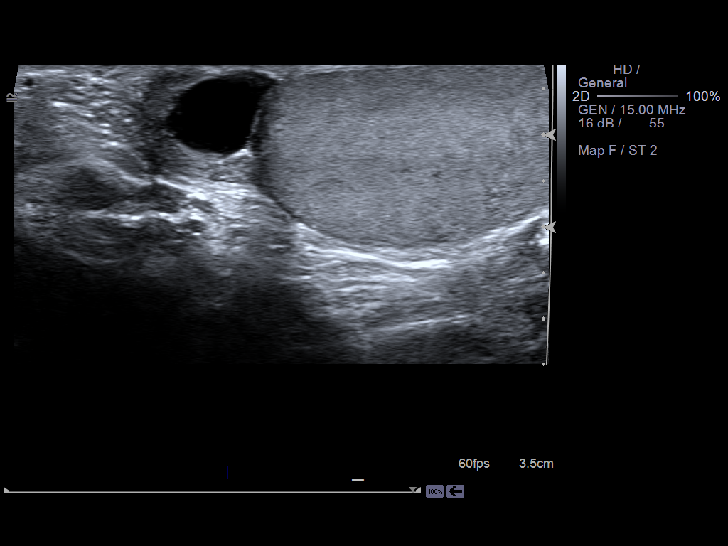
[im 65/65]
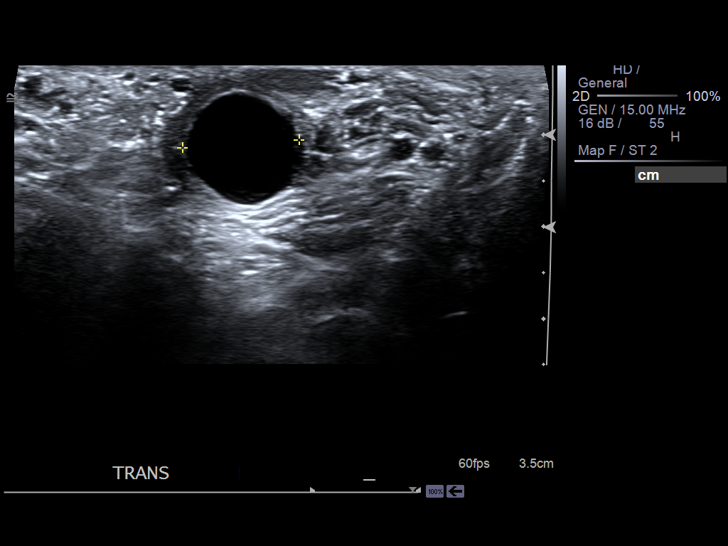

[13 of 25 positions shown; findings below may reference images not displayed]

FINDINGS: Right testicle

Measurements: 4.1 x 2.2 x 2.9 cm. No mass or microlithiasis
visualized. Echotexture within normal limits

Left testicle

Measurements: 4.2 x 2.3 x 3.3 cm. Echotexture within normal limits.
No mass or microlithiasis visualized.

Right epididymis: Multiple epididymal cysts, up to 10 mm
individually. No hypervascularity.

Left epididymis: Unilocular epididymal head cyst up to 15 mm.
Multiple additional epididymal cysts otherwise up to 13 mm. No
hypervascularity.

Hydrocele:  None visualized.

Varicocele:  None visualized.

Pulsed Doppler interrogation of both testes demonstrates low
resistance arterial and venous waveforms bilaterally.
IMPRESSION: 1. No testicular mass or torsion.
2. No suspicious scrotal findings. Incidental bilateral epididymal
cysts, which might be palpable.

## 2015-04-24 IMAGING — CT CT CHEST-ABD-PELV W/ CM
2 of 5 series · 14 of 46 positions shown, 16 images · IV contrast (isovue)
Comparison: AP CT on 11/29/2012

CLINICAL DATA: Followup metastatic colon carcinoma. Status post
chemotherapy.

EXAM:
CT CHEST, ABDOMEN, AND PELVIS WITH CONTRAST
TECHNIQUE: Multidetector CT imaging of the chest, abdomen and pelvis was
performed following the standard protocol during bolus
administration of intravenous contrast.
CONTRAST:  125 mL Isovue 370

[Series 2: cap with · axial · 0.79mm/px · z∈[-1088,-478]mm · 11 of 138 slices shown, 13 images]
[im 8/138  soft-tissue]
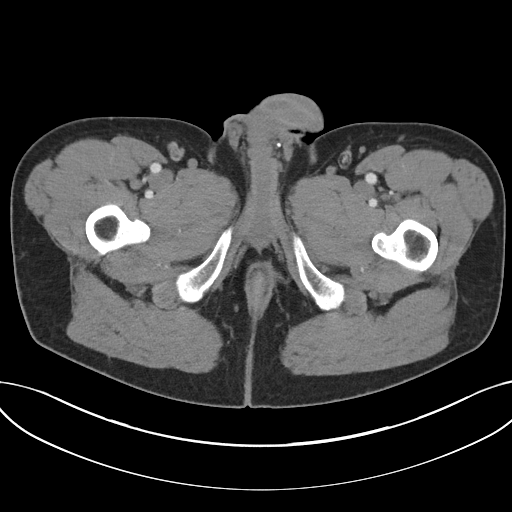
[im 8/138  bone]
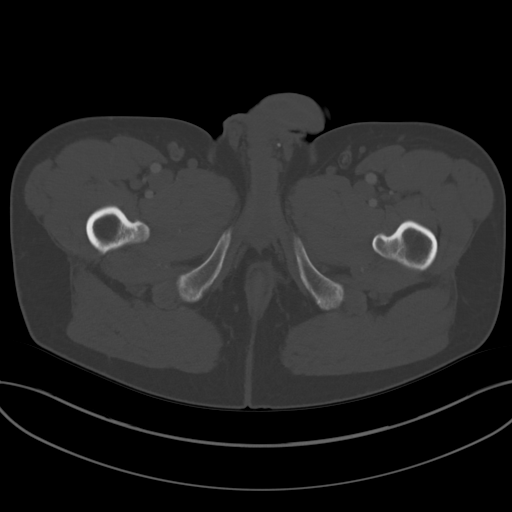
[im 23/138  soft-tissue]
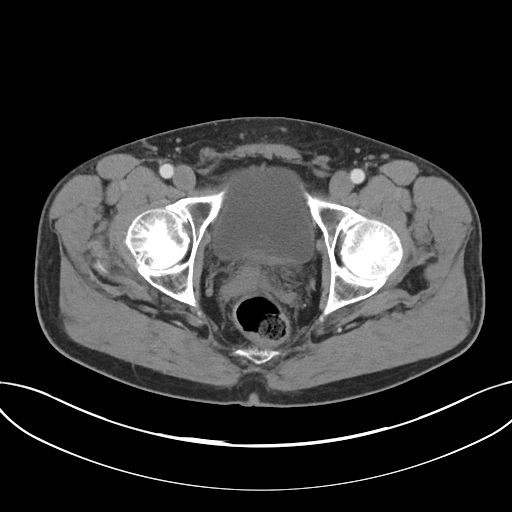
[im 31/138  soft-tissue]
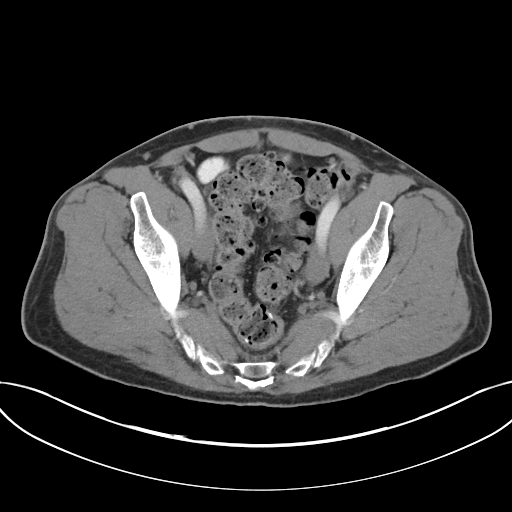
[im 46/138  soft-tissue]
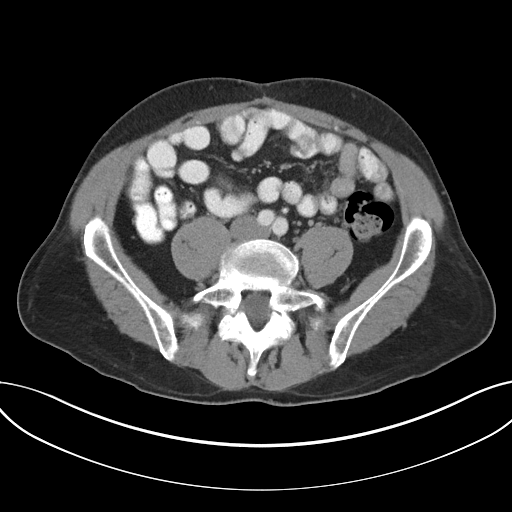
[im 54/138  soft-tissue]
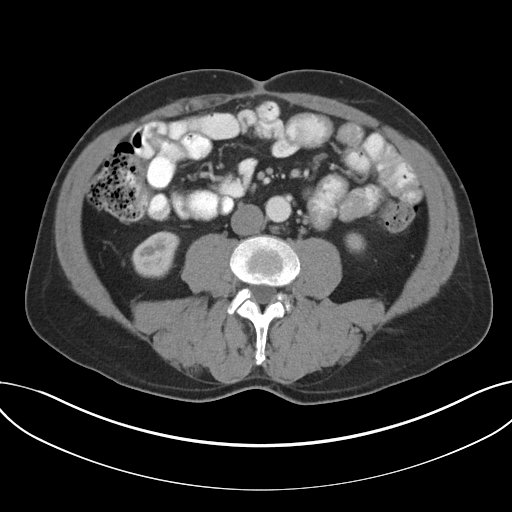
[im 69/138  soft-tissue]
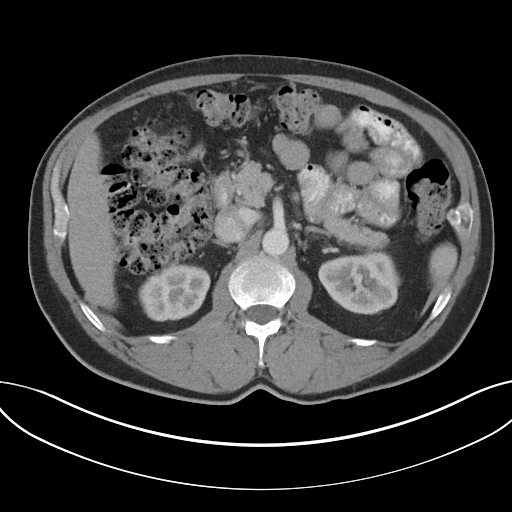
[im 84/138  soft-tissue]
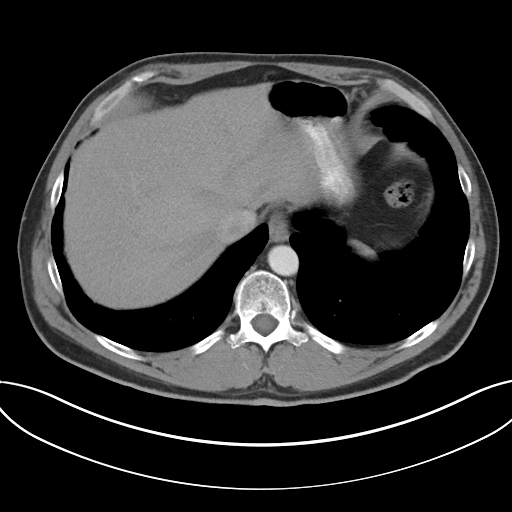
[im 92/138  soft-tissue]
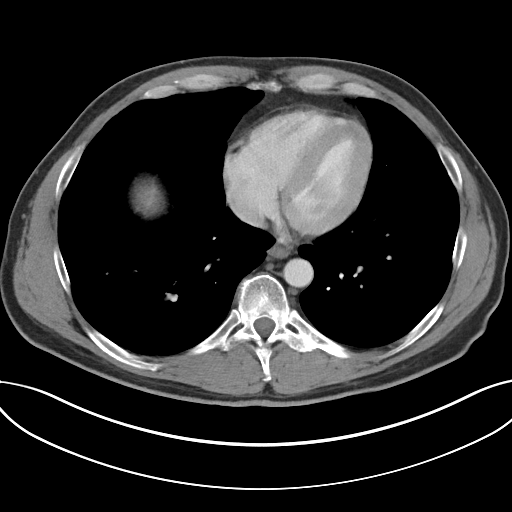
[im 107/138  soft-tissue]
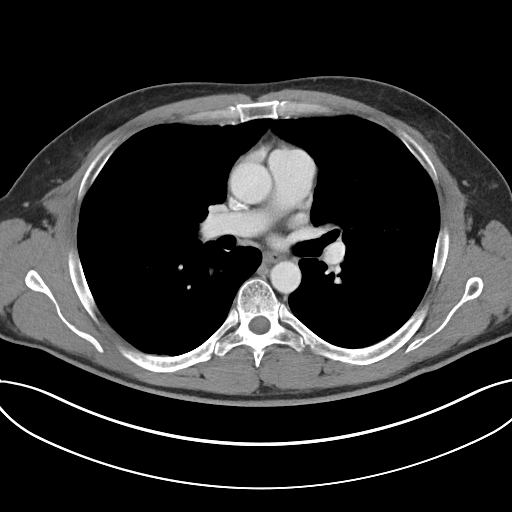
[im 107/138  bone]
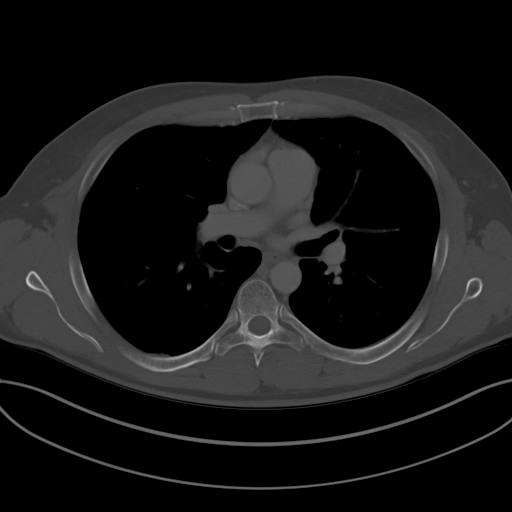
[im 115/138  soft-tissue]
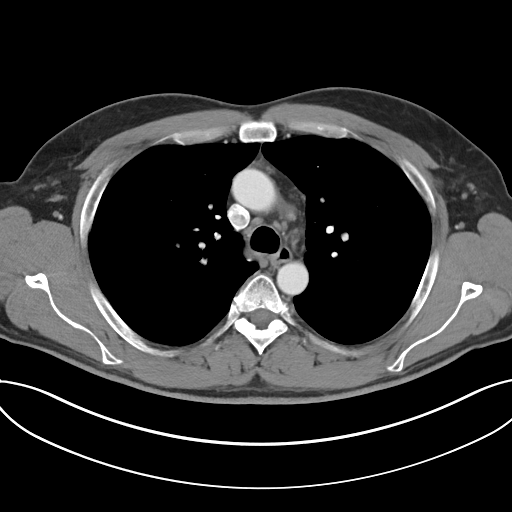
[im 130/138  soft-tissue]
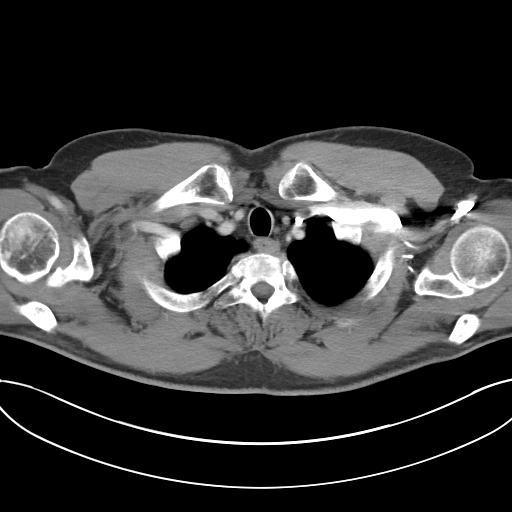

[Series 6: cor cap with · coronal · 0.79mm/px · 3 of 138 slices shown]
[im 46/138  soft-tissue]
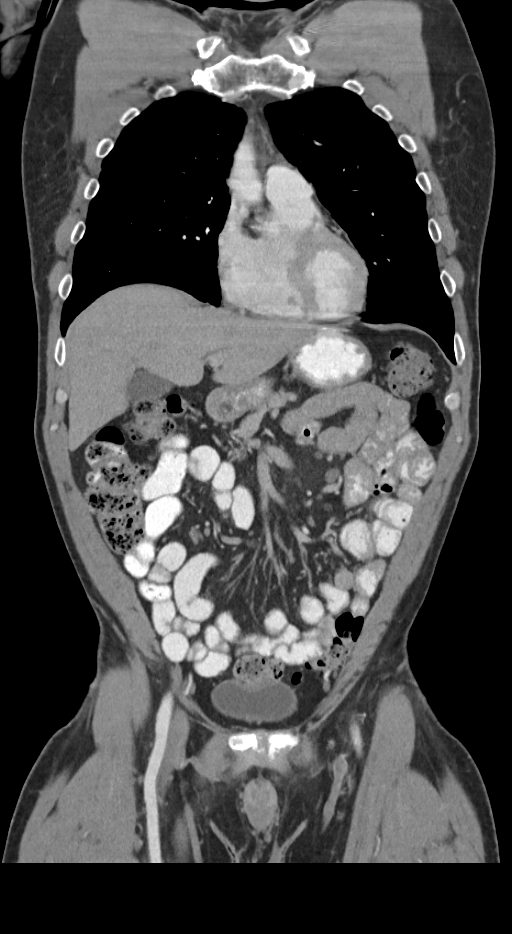
[im 61/138  soft-tissue]
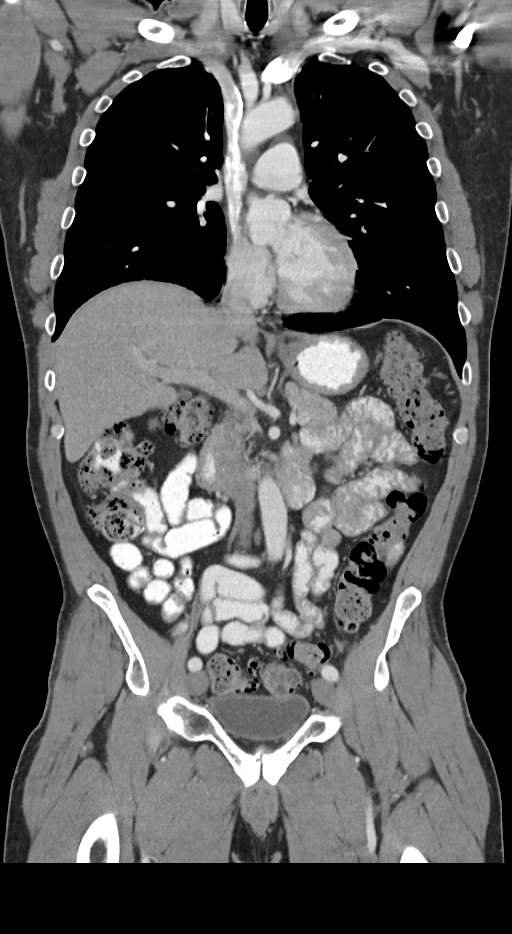
[im 77/138  soft-tissue]
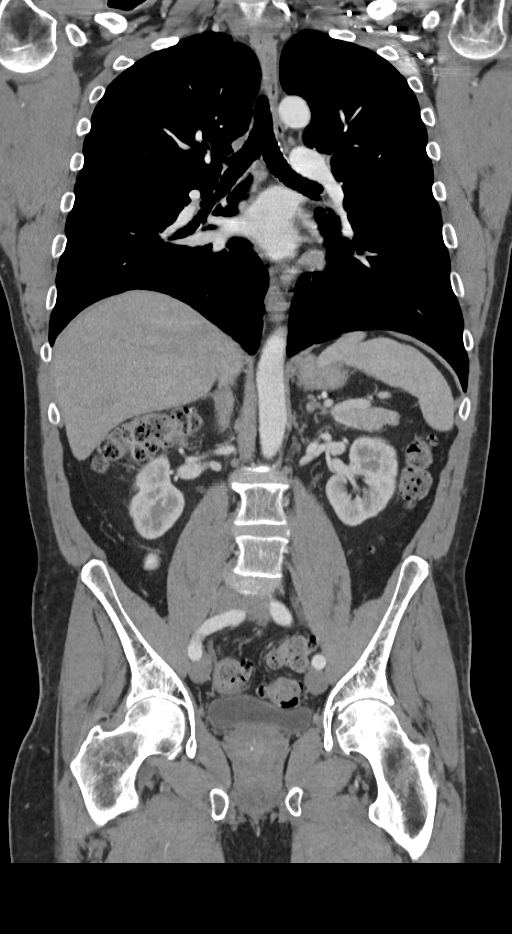

[14 of 46 positions shown; findings below may reference images not displayed]

FINDINGS: CT CHEST FINDINGS

Mediastinum/Hilar Regions: No masses or pathologically enlarged
lymph nodes identified.

Other Thoracic Lymphadenopathy:  None.

Lungs:  No pulmonary infiltrate or mass identified.

Pleura:  No evidence of effusion or mass.

Vascular/Cardiac: No thoracic aortic aneurysm or other significant
abnormality identified.

Musculoskeletal:  No suspicious bone lesions identified.

Other:  None.

CT ABDOMEN AND PELVIS FINDINGS

Hepatobiliary: Multiple tiny hepatic cysts appears stable. No
definite liver masses are identified.

Pancreas: No mass, inflammatory changes, or other parenchymal
abnormality identified.

Spleen:  Within normal limits in size and appearance.

Adrenal Glands:  No mass identified.

Kidneys/Urinary Tract: No masses identified. No evidence of
hydronephrosis. Small left renal cyst is stable.

Stomach/Bowel/Peritoneum: Diverticulosis again noted, without
evidence of diverticulitis. Large colonic stool burden demonstrated.
No evidence of dilated small bowel.

Lymph Nodes:  No pathologically enlarged lymph nodes identified.

Reproductive:  No mass or other significant abnormality identified.

Vascular:  No evidence of abdominal aortic aneurysm.

Musculoskeletal: No suspicious bone lesions identified. Bilateral L5
pars defects again noted, with degenerative disc disease and grade 1
anterolisthesis at L5-S1.

Other:  None.
IMPRESSION: No evidence of recurrent or metastatic carcinoma within the chest,
abdomen, or pelvis.

Diverticulosis. No radiographic evidence of diverticulitis or other
acute findings.

## 2020-03-07 ENCOUNTER — Encounter: Payer: Self-pay | Admitting: Obstetrics and Gynecology

## 2022-09-21 ENCOUNTER — Ambulatory Visit: Payer: BC Managed Care – PPO

## 2022-09-21 DIAGNOSIS — Z980 Intestinal bypass and anastomosis status: Secondary | ICD-10-CM

## 2022-09-21 DIAGNOSIS — K5730 Diverticulosis of large intestine without perforation or abscess without bleeding: Secondary | ICD-10-CM

## 2022-09-21 DIAGNOSIS — Z85038 Personal history of other malignant neoplasm of large intestine: Secondary | ICD-10-CM

## 2022-09-21 DIAGNOSIS — Z1211 Encounter for screening for malignant neoplasm of colon: Secondary | ICD-10-CM

## 2022-09-21 DIAGNOSIS — K640 First degree hemorrhoids: Secondary | ICD-10-CM
